# Patient Record
Sex: Male | Born: 1995 | Race: White | Hispanic: No | Marital: Single | Smoking: Never smoker
Health system: Southern US, Community
[De-identification: ages and names within clinical notes are randomized; demographics above are authoritative.]

## PROBLEM LIST (undated history)

## (undated) DIAGNOSIS — J45909 Unspecified asthma, uncomplicated: Secondary | ICD-10-CM

## (undated) HISTORY — PX: TONSILLECTOMY: SUR1361

---

## 2019-05-21 ENCOUNTER — Encounter (HOSPITAL_COMMUNITY): Payer: Self-pay

## 2019-05-21 ENCOUNTER — Other Ambulatory Visit: Payer: Self-pay

## 2019-05-21 ENCOUNTER — Emergency Department (HOSPITAL_COMMUNITY): Payer: 59

## 2019-05-21 ENCOUNTER — Emergency Department (HOSPITAL_COMMUNITY)
Admission: EM | Admit: 2019-05-21 | Discharge: 2019-05-22 | Disposition: A | Discharge status: Home / Self Care | Payer: 59 | Attending: Emergency Medicine | Admitting: Emergency Medicine

## 2019-05-21 DIAGNOSIS — J4520 Mild intermittent asthma, uncomplicated: Secondary | ICD-10-CM | POA: Diagnosis not present

## 2019-05-21 DIAGNOSIS — F17290 Nicotine dependence, other tobacco product, uncomplicated: Secondary | ICD-10-CM | POA: Insufficient documentation

## 2019-05-21 DIAGNOSIS — R05 Cough: Secondary | ICD-10-CM | POA: Diagnosis present

## 2019-05-21 DIAGNOSIS — J189 Pneumonia, unspecified organism: Secondary | ICD-10-CM | POA: Diagnosis not present

## 2019-05-21 DIAGNOSIS — Z20828 Contact with and (suspected) exposure to other viral communicable diseases: Secondary | ICD-10-CM | POA: Diagnosis not present

## 2019-05-21 HISTORY — DX: Unspecified asthma, uncomplicated: J45.909

## 2019-05-21 MED ORDER — ALBUTEROL SULFATE (5 MG/ML) 0.5% IN NEBU: 2.5000 mg | INHALATION_SOLUTION | Freq: Four times a day (QID) | RESPIRATORY_TRACT | 0 refills | Status: AC | PRN

## 2019-05-21 MED ORDER — ALBUTEROL SULFATE HFA 108 (90 BASE) MCG/ACT IN AERS
4.0000 | INHALATION_SPRAY | Freq: Once | RESPIRATORY_TRACT | Status: AC
  Administered 2019-05-21: 23:00:00 4 via RESPIRATORY_TRACT
  Filled 2019-05-21: qty 6.7

## 2019-05-21 MED ORDER — AZITHROMYCIN 250 MG PO TABS: 250.0000 mg | ORAL_TABLET | Freq: Every day | ORAL | 0 refills | Status: DC

## 2019-05-21 MED ORDER — PREDNISONE 20 MG PO TABS: 40.0000 mg | ORAL_TABLET | Freq: Every day | ORAL | 0 refills | Status: DC

## 2019-05-21 MED ORDER — PREDNISONE 50 MG PO TABS
60.0000 mg | ORAL_TABLET | Freq: Once | ORAL | Status: AC
  Administered 2019-05-21: 60 mg via ORAL
  Filled 2019-05-21: qty 1

## 2019-05-21 NOTE — ED Notes (Signed)
Pt ambulated around nurses station. Maintained 96% on RA

## 2019-05-21 NOTE — ED Triage Notes (Signed)
Pt arrives via POV from home c/o cough progressively getting over past week. Pt reports dyspnea upon exertion. Pt has Hx of Asthma but does not take any medications regularly to treat this. Pt reports taking Mucinex apprx 1975 this evening without any relief of symptoms.

## 2019-05-21 NOTE — ED Provider Notes (Addendum)
[facility] EMERGENCY DEPARTMENT Provider Note   CSN: [id number] Arrival date & time: 05/21/19  2113   History Chief Complaint  Patient presents with  . Cough    Jeremy Brady is a 23 y.o. male with history of asthma who presents with a cough.  Patient states that symptoms have been going on approximately 1 week.  He reports a cough which is sometimes productive of white sputum and sometimes dry, nasal congestion and runny nose with postnasal drip, sinus pressure, shortness of breath with exertion and wheezing.  He reports history of asthma but states that it has been controlled for years and he does not have an inhaler.  He has been taking Mucinex with some relief of symptoms.  Symptoms being progressively worsening and therefore he came to the ED tonight.  Denies fever, chills, sweats, body aches, headache, anosmia, sore throat, chest pain, nausea, vomiting or diarrhea.  He does not know of any sick contacts.  He states that every year he gets similar symptoms but they are usually not as bad.  He does not smoke.  HPI     Past Medical History:  Diagnosis Date  . Asthma     There are no problems to display for this patient.   Past Surgical History:  Procedure Laterality Date  . TONSILLECTOMY         History reviewed. No pertinent family history.  Social History   Tobacco Use  . Smoking status: Never Smoker  . Smokeless tobacco: Current User    Types: Chew  Substance Use Topics  . Alcohol use: Not Currently  . Drug use: Not Currently    Home Medications Prior to Admission medications   Not on File    Allergies    Pollen extract-tree extract [pollen extract]  Review of Systems   Review of Systems  Constitutional: Negative for chills, diaphoresis and fever.  HENT: Positive for congestion, postnasal drip and sinus pressure. Negative for sore throat.   Respiratory: Positive for cough, shortness of breath and wheezing.   Cardiovascular: Negative for chest  pain.  All other systems reviewed and are negative.   Physical Exam Updated Vital Signs BP (!) 164/98 (BP Location: Right Arm)   Pulse 99   Temp 98.6 F (37 C) (Oral)   Resp 20   Ht 6\' 2"  (1.88 m)   Wt (!) 163.3 kg   SpO2 95%   BMI 46.22 kg/m   Physical Exam Vitals and nursing note reviewed.  Constitutional:      General: He is not in acute distress.    Appearance: He is well-developed. He is obese. He is not ill-appearing.  HENT:     Head: Normocephalic and atraumatic.     Right Ear: Tympanic membrane is scarred.     Left Ear: Tympanic membrane is not scarred.     Nose: Congestion and rhinorrhea present.     Mouth/Throat:     Lips: Pink.     Mouth: Mucous membranes are moist.     Pharynx: Oropharynx is clear.  Eyes:     General: No scleral icterus.       Right eye: No discharge.        Left eye: No discharge.     Conjunctiva/sclera: Conjunctivae normal.     Pupils: Pupils are equal, round, and reactive to light.  Cardiovascular:     Rate and Rhythm: Normal rate and regular rhythm.  Pulmonary:     Effort: Pulmonary effort is normal. No respiratory distress.  Breath sounds: Wheezing (diffuse, mild expiratory wheezes) present.  Abdominal:     General: There is no distension.  Musculoskeletal:     Cervical back: Normal range of motion.  Skin:    General: Skin is warm and dry.  Neurological:     Mental Status: He is alert and oriented to person, place, and time.  Psychiatric:        Behavior: Behavior normal.     ED Results / Procedures / Treatments   Labs (all labs ordered are listed, but only abnormal results are displayed) Labs Reviewed  NOVEL CORONAVIRUS, NAA (HOSP ORDER, SEND-OUT TO REF LAB; TAT 18-24 HRS)    EKG None  Radiology DG Chest Portable 1 View  Result Date: 05/21/2019 CLINICAL DATA:  Shortness of breath. EXAM: PORTABLE CHEST 1 VIEW COMPARISON:  None. FINDINGS: There are ill-defined bilateral airspace opacities for example in the right  mid to upper lung zone. The lung volumes are low. The heart size appears enlarged. There is no pneumothorax or large pleural effusion. IMPRESSION: Ill-defined bilateral airspace opacities raises concern pneumonia in the appropriate clinical setting. Electronically Signed   By: Constance Holster M.D.   On: 05/21/2019 22:40    Procedures Procedures (including critical care time)  Medications Ordered in ED Medications  albuterol (VENTOLIN HFA) 108 (90 Base) MCG/ACT inhaler 4 puff (4 puffs Inhalation Given 05/21/19 2248)  predniSONE (DELTASONE) tablet 60 mg (60 mg Oral Given 05/21/19 2248)    ED Course  I have reviewed the triage vital signs and the nursing notes.  Pertinent labs & imaging results that were available during my care of the patient were reviewed by me and considered in my medical decision making (see chart for details).  23 year old male presents with runny nose, congestion, cough, wheezing that have been ongoing for about a week. Symptoms are consistent with viral illness causing a mild asthma exacerbation vs bronchitis. He has mild wheezing on exam with nasal congestion.   CXR shows bilateral opacities in the right mid and upper lung concerning for multifocal pneumonia. He was give albuterol inhaler and dose of prednisone here and breathing is better. He ambulated in the ED and maintained his sats. He was initially resistant to COVID testing because he states he hasn't had a fever but I was able to convince him to do this. He was advised to quarantine until he gets his results. Will rx Z-pack, steroid, and albuterol solution for his nebulizer. He was given return precautions.  MDM Rules/Calculators/A&P   Final Clinical Impression(s) / ED Diagnoses Final diagnoses:  Multifocal pneumonia  Mild intermittent asthma, unspecified whether complicated    Rx / DC Orders ED Discharge Orders    None        Recardo Evangelist, PA-C 05/22/19 1752    Margette Fast,  MD 05/23/19 [phone]

## 2019-05-21 NOTE — Discharge Instructions (Signed)
Please quarantine until you get the results of your COVID test which could take up to 2 days Use inhaler or nebulizer as needed for wheezing or shortness of breath Continue Mucinex for cough Try Flonase or Nasocort for congestion Take Prednisone for the next 4 days Take Z-pack (antibiotic) for the next 5 days Please return if worsening

## 2019-05-28 LAB — NOVEL CORONAVIRUS, NAA (HOSP ORDER, SEND-OUT TO REF LAB; TAT 18-24 HRS): SARS-CoV-2, NAA: NOT DETECTED

## 2021-06-25 IMAGING — DX DG CHEST 1V PORT
1 series · 1 of 1 positions shown · non-contrast
Comparison: None.

CLINICAL DATA: Shortness of breath.

EXAM:
PORTABLE CHEST 1 VIEW

[chest ap grid]
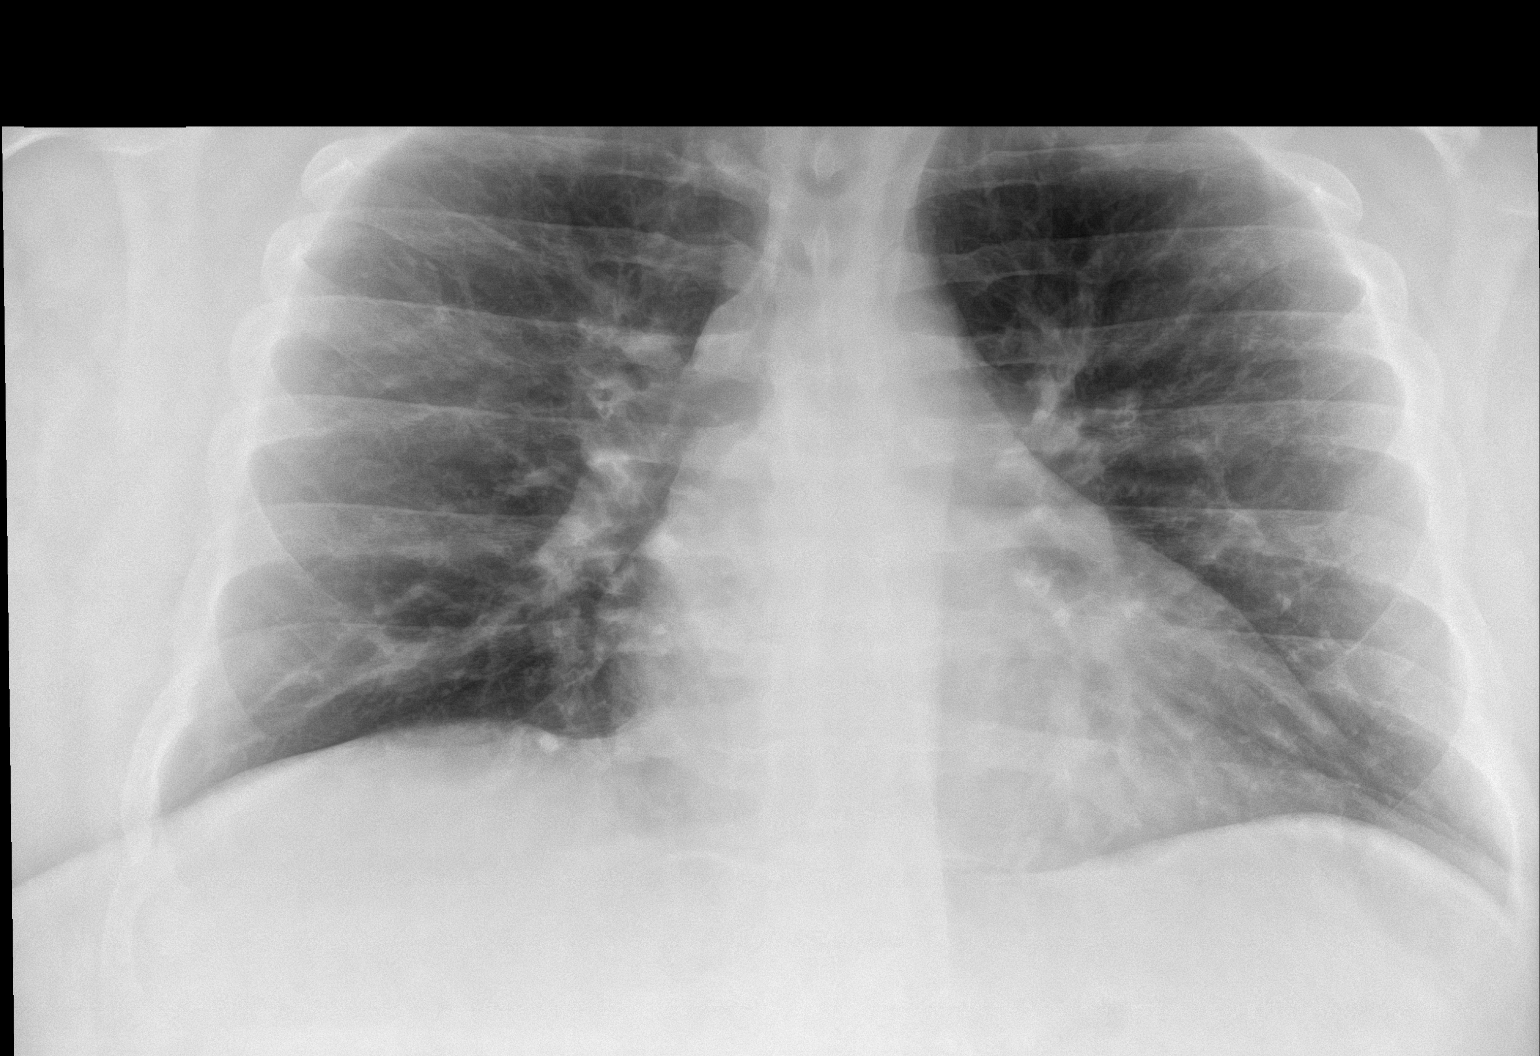

[1 of 1 positions shown; findings below may reference images not displayed]

FINDINGS: There are ill-defined bilateral airspace opacities for example in
the right mid to upper lung zone. The lung volumes are low. The
heart size appears enlarged. There is no pneumothorax or large
pleural effusion.
IMPRESSION: Ill-defined bilateral airspace opacities raises concern pneumonia in
the appropriate clinical setting.

## 2021-07-02 ENCOUNTER — Other Ambulatory Visit: Payer: Self-pay

## 2021-07-02 ENCOUNTER — Emergency Department (HOSPITAL_COMMUNITY)
Admission: EM | Admit: 2021-07-02 | Discharge: 2021-07-02 | Disposition: A | Discharge status: Home / Self Care | Payer: 59 | Attending: Emergency Medicine | Admitting: Emergency Medicine

## 2021-07-02 ENCOUNTER — Encounter (HOSPITAL_COMMUNITY): Payer: Self-pay | Admitting: *Deleted

## 2021-07-02 DIAGNOSIS — H9201 Otalgia, right ear: Secondary | ICD-10-CM | POA: Insufficient documentation

## 2021-07-02 DIAGNOSIS — H65191 Other acute nonsuppurative otitis media, right ear: Secondary | ICD-10-CM

## 2021-07-02 DIAGNOSIS — H60501 Unspecified acute noninfective otitis externa, right ear: Secondary | ICD-10-CM

## 2021-07-02 MED ORDER — CEFDINIR 300 MG PO CAPS: 300.0000 mg | ORAL_CAPSULE | Freq: Two times a day (BID) | ORAL | 0 refills | Status: DC

## 2021-07-02 MED ORDER — CIPROFLOXACIN-DEXAMETHASONE 0.3-0.1 % OT SUSP
4.0000 [drp] | Freq: Two times a day (BID) | OTIC | Status: AC
  Administered 2021-07-02: 4 [drp] via OTIC
  Filled 2021-07-02: qty 7.5

## 2021-07-02 NOTE — ED Triage Notes (Signed)
Right earache with drainage

## 2021-07-02 NOTE — ED Provider Notes (Signed)
[facility] EMERGENCY DEPARTMENT Provider Note   CSN: [id number] Arrival date & time: 07/02/21  1647     History  Chief Complaint  Patient presents with   Otalgia    Jeremy Brady is a 26 y.o. male who presents emergency department chief complaint of right-sided ear pain.  Patient states that he had an upper respiratory infection about 3 weeks ago.  He states later it moved down into his chest and then moved back up into his ears.  He has had drainage from the right ear for the past several days.  He complains of aching pain.  He denies fevers, chills, neck stiffness.  Otalgia     Home Medications Prior to Admission medications   Medication Sig Start Date End Date Taking? Authorizing Provider  albuterol (PROVENTIL) (5 MG/ML) 0.5% nebulizer solution Take 0.5 mLs (2.5 mg total) by nebulization every 6 (six) hours as needed for wheezing or shortness of breath. 05/21/19   Recardo Evangelist, PA-C  azithromycin (ZITHROMAX) 250 MG tablet Take 1 tablet (250 mg total) by mouth daily. Take first 2 tablets together, then 1 every day until finished. 05/21/19   Recardo Evangelist, PA-C  predniSONE (DELTASONE) 20 MG tablet Take 2 tablets (40 mg total) by mouth daily. 05/21/19   Recardo Evangelist, PA-C      Allergies    Pollen extract-tree extract [pollen extract]    Review of Systems   Review of Systems  HENT:  Positive for ear pain.   As per the HPI Physical Exam Updated Vital Signs BP (!) 150/87    Pulse 95    Temp 98.2 F (36.8 C) (Oral)    Resp 20    Ht 6\' 2"  (1.88 m)    Wt (!) 163.3 kg    SpO2 99%    BMI 46.22 kg/m  Physical Exam Physical Exam  Nursing note and vitals reviewed. Constitutional: He appears well-developed and well-nourished. No distress.  HENT:  Head: Normocephalic and atraumatic.  Eyes: Conjunctivae normal are normal. No scleral icterus.  Ears: Left ear normal external pinna, canal, TM, right ear with normal external pinna, tenderness with ear movement,  drainage and mild swelling noted within the canal, erythema of the right TM Neck: Normal range of motion. Neck supple.  Cardiovascular: Normal rate, regular rhythm and normal heart sounds.   Pulmonary/Chest: Effort normal and breath sounds normal. No respiratory distress.  Abdominal: Soft. There is no tenderness.  Musculoskeletal: He exhibits no edema.  Neurological: He is alert.  Skin: Skin is warm and dry. He is not diaphoretic.  Psychiatric: His behavior is normal.   ED Results / Procedures / Treatments   Labs (all labs ordered are listed, but only abnormal results are displayed) Labs Reviewed - No data to display  EKG None  Radiology No results found.  Procedures Procedures    Medications Ordered in ED Medications  ciprofloxacin-dexamethasone (CIPRODEX) 0.3-0.1 % OTIC (EAR) suspension 4 drop (4 drops Right EAR Given by Other 07/02/21 1846)    ED Course/ Medical Decision Making/ A&P                           Medical Decision Making 26 year old male with history of URI, seasonal allergies.  No history of diabetes.  Patient does not wish to have respiratory viral panel testing at this time.  He complains of right ear pain.  He appears to have right-sided otitis externa and may also have some otitis  media as evidenced by very erythematous TM.  We will treat for both.  He has a history of penicillin allergy and will treat with cefdinir and Ciprodex.  Patient did not have evidence of mastoiditis or meningitis  Risk Prescription drug management.      Final Clinical Impression(s) / ED Diagnoses Final diagnoses:  None    Rx / DC Orders ED Discharge Orders     None         Margarita Mail, PA-C 07/02/21 2036    Fredia Sorrow, MD 07/08/21 [phone]

## 2021-07-02 NOTE — Discharge Instructions (Addendum)
Apply 4 drops of ciprodex in the R ear as 2 times a day for 7 days. Take your oral antibiotics as directed until you have finished the entire prescriptions. Contact a health care provider if: You have bleeding from your nose. There is a lump on your neck. You are not feeling better in 5 days. You feel worse instead of better. Get help right away if: You have severe pain that is not controlled with medicine. You have swelling, redness, or pain around your ear. You have stiffness in your neck. A part of your face is not moving (paralyzed). The bone behind your ear (mastoid bone) is tender when you touch it. You develop a severe headache.

## 2021-07-22 ENCOUNTER — Encounter (HOSPITAL_COMMUNITY): Payer: Self-pay | Admitting: Emergency Medicine

## 2021-07-22 ENCOUNTER — Emergency Department (HOSPITAL_COMMUNITY)
Admission: EM | Admit: 2021-07-22 | Discharge: 2021-07-22 | Disposition: A | Discharge status: Home / Self Care | Payer: Self-pay | Attending: Emergency Medicine | Admitting: Emergency Medicine

## 2021-07-22 DIAGNOSIS — Z7951 Long term (current) use of inhaled steroids: Secondary | ICD-10-CM | POA: Insufficient documentation

## 2021-07-22 DIAGNOSIS — J302 Other seasonal allergic rhinitis: Secondary | ICD-10-CM

## 2021-07-22 DIAGNOSIS — J45909 Unspecified asthma, uncomplicated: Secondary | ICD-10-CM | POA: Insufficient documentation

## 2021-07-22 DIAGNOSIS — R002 Palpitations: Secondary | ICD-10-CM | POA: Insufficient documentation

## 2021-07-22 DIAGNOSIS — H9201 Otalgia, right ear: Secondary | ICD-10-CM | POA: Insufficient documentation

## 2021-07-22 MED ORDER — MONTELUKAST SODIUM 10 MG PO TABS: 10.0000 mg | ORAL_TABLET | Freq: Every day | ORAL | 0 refills | Status: AC

## 2021-07-22 NOTE — ED Triage Notes (Signed)
Pt c/o sinus pressure and drainage and headache. Pt also states he feels like he is having heart palpitations since he was seen here last time but it wasn't addressed.

## 2021-07-22 NOTE — Discharge Instructions (Signed)
Please start taking Singulair or montelukast daily, Zyrtec 10 mg daily and Flonase in the morning 2 puffs in each nostril.  You can follow-up with your family doctor if no improvement within 1 week, emergency department for worsening symptoms.  Please see the cardiologist regarding your palpitations, you may need to go onto a heart monitor for a couple of days to make sure there is nothing else going on.  Your EKG tonight has looked reassuring and showed no signs of heart attack

## 2021-07-22 NOTE — ED Provider Notes (Signed)
[facility] EMERGENCY DEPARTMENT Provider Note   CSN: [id number] Arrival date & time: 07/22/21  2122     History  Chief Complaint  Patient presents with   Recurrent Sinusitis    Jeremy Brady is a 26 y.o. male.  HPI  This patient is a 26 year old male, he states that he has a history of asthma.  He was in the emergency department about 3 weeks ago complaining of right ear pain and was treated with Omnicef and Ciprodex eardrops for what appears to be otitis externa and otitis media.  He improved with regards to his ear but over the last several weeks has had ongoing feeling of sinus pressure intermittently, drainage intermittently and a feeling of congestion in the back of his throat which does not seem to go away.  He has not been taking his allergy medication such as Zyrtec or Nasacort because he was taking antibiotics and never started it back after the antibiotics were finished.  He has no fevers or chills, no nausea or vomiting.  He does report a feeling of palpitations that he describes as his heart beating hard especially when he is at work exerting himself and occasionally spontaneously throughout the day, he is not feeling it at this time.  There is no shortness of breath, no swelling of the legs, no cough.  Home Medications Prior to Admission medications   Medication Sig Start Date End Date Taking? Authorizing Provider  montelukast (SINGULAIR) 10 MG tablet Take 1 tablet (10 mg total) by mouth at bedtime. 07/22/21 08/21/21 Yes Eber Hong, MD  albuterol (PROVENTIL) (5 MG/ML) 0.5% nebulizer solution Take 0.5 mLs (2.5 mg total) by nebulization every 6 (six) hours as needed for wheezing or shortness of breath. 05/21/19   Bethel Born, PA-C      Allergies    Pollen extract-tree extract [pollen extract]    Review of Systems   Review of Systems  All other systems reviewed and are negative.  Physical Exam Updated Vital Signs BP (!) 185/117 (BP Location: Right Arm)     Pulse 95    Temp 98.4 F (36.9 C) (Oral)    Resp 20    Ht 1.88 m (6\' 2" )    Wt (!) 163.3 kg    SpO2 99%    BMI 46.22 kg/m  Physical Exam Vitals and nursing note reviewed.  Constitutional:      General: He is not in acute distress.    Appearance: He is well-developed.  HENT:     Head: Normocephalic and atraumatic.     Right Ear: Tympanic membrane, ear canal and external ear normal. There is no impacted cerumen.     Left Ear: Tympanic membrane, ear canal and external ear normal. There is no impacted cerumen.     Ears:     Comments: External ears, canals and tympanic membranes are clear bilaterally, there is no tenderness including over the mastoid process bilaterally    Mouth/Throat:     Mouth: Mucous membranes are moist.     Pharynx: No oropharyngeal exudate.     Comments: No drainage in the back of the throat, the nasal passages are rather narrow but there is no drainage from the nose Eyes:     General: No scleral icterus.       Right eye: No discharge.        Left eye: No discharge.     Conjunctiva/sclera: Conjunctivae normal.     Pupils: Pupils are equal, round, and reactive to light.  Neck:     Thyroid: No thyromegaly.     Vascular: No JVD.  Cardiovascular:     Rate and Rhythm: Normal rate and regular rhythm.     Heart sounds: Normal heart sounds. No murmur heard.   No friction rub. No gallop.     Comments: Normal heart rate, rate of approximately 80 bpm on my exam with the occasional ectopic beat. Pulmonary:     Effort: Pulmonary effort is normal. No respiratory distress.     Breath sounds: Normal breath sounds. No wheezing or rales.  Abdominal:     General: Bowel sounds are normal. There is no distension.     Palpations: Abdomen is soft. There is no mass.     Tenderness: There is no abdominal tenderness.  Musculoskeletal:        General: No tenderness. Normal range of motion.     Cervical back: Normal range of motion and neck supple.     Right lower leg: No edema.      Left lower leg: No edema.  Lymphadenopathy:     Cervical: No cervical adenopathy.  Skin:    General: Skin is warm and dry.     Findings: No erythema or rash.  Neurological:     Mental Status: He is alert.     Coordination: Coordination normal.  Psychiatric:        Behavior: Behavior normal.    ED Results / Procedures / Treatments   Labs (all labs ordered are listed, but only abnormal results are displayed) Labs Reviewed - No data to display  EKG EKG Interpretation  Date/Time:  "Sunday July 22 2021 21:52:27 EST Ventricular Rate:  96 PR Interval:  164 QRS Duration: 100 QT Interval:  345 QTC Calculation: 436 R Axis:   2 Text Interpretation: Sinus rhythm Normal ECG no ectopy or arrhythmia Confirmed by Asaiah Scarber ([id number]) on 07/22/2021 10:03:54 PM  Radiology No results found.  Procedures Procedures    Medications Ordered in ED Medications - No data to display  ED Course/ Medical Decision Making/ A&P                           Medical Decision Making Amount and/or Complexity of Data Reviewed ECG/medicine tests: ordered.   Patient is well-appearing, has some signs of upper respiratory symptoms which are more than likely related to allergies.  He will need to go back on his antihistamine, he should go back onto a nasal steroid and onto Singulair.  His EKG has been performed and is overall unremarkable.  I think he should follow-up with cardiology given the exertional nature of the palpitations that he has.  I see no signs of an accessory pathway or other arrhythmia or ischemia on today's EKG.  The patient is agreeable to the plan        Final Clinical Impression(s) / ED Diagnoses Final diagnoses:  Palpitations  Seasonal allergic rhinitis, unspecified trigger    Rx / DC Orders ED Discharge Orders          Ordered    montelukast (SINGULAIR) 10 MG tablet  Daily at bedtime        02" /12/23 2146              [id number], MD 07/22/21 2204
